# Patient Record
Sex: Male | Born: 1960 | Race: White | Hispanic: No | Marital: Married | Smoking: Former smoker
Health system: Southern US, Community
[De-identification: ages and names within clinical notes are randomized; demographics above are authoritative.]

## PROBLEM LIST (undated history)

## (undated) DIAGNOSIS — E7800 Pure hypercholesterolemia, unspecified: Secondary | ICD-10-CM

## (undated) DIAGNOSIS — Z8739 Personal history of other diseases of the musculoskeletal system and connective tissue: Secondary | ICD-10-CM

## (undated) DIAGNOSIS — F419 Anxiety disorder, unspecified: Secondary | ICD-10-CM

## (undated) DIAGNOSIS — G629 Polyneuropathy, unspecified: Secondary | ICD-10-CM

## (undated) DIAGNOSIS — I2510 Atherosclerotic heart disease of native coronary artery without angina pectoris: Secondary | ICD-10-CM

## (undated) DIAGNOSIS — I10 Essential (primary) hypertension: Secondary | ICD-10-CM

## (undated) DIAGNOSIS — G894 Chronic pain syndrome: Secondary | ICD-10-CM

## (undated) HISTORY — DX: Polyneuropathy, unspecified: G62.9

## (undated) HISTORY — DX: Essential (primary) hypertension: I10

## (undated) HISTORY — DX: Anxiety disorder, unspecified: F41.9

## (undated) HISTORY — DX: Chronic pain syndrome: G89.4

## (undated) HISTORY — DX: Atherosclerotic heart disease of native coronary artery without angina pectoris: I25.10

---

## 2011-07-25 ENCOUNTER — Emergency Department (HOSPITAL_COMMUNITY): Payer: Self-pay

## 2011-07-25 ENCOUNTER — Other Ambulatory Visit: Payer: Self-pay

## 2011-07-25 ENCOUNTER — Emergency Department (HOSPITAL_COMMUNITY)
Admission: EM | Admit: 2011-07-25 | Discharge: 2011-07-25 | Disposition: A | Discharge status: Home / Self Care | Payer: Self-pay | Attending: Emergency Medicine | Admitting: Emergency Medicine

## 2011-07-25 DIAGNOSIS — R05 Cough: Secondary | ICD-10-CM | POA: Insufficient documentation

## 2011-07-25 DIAGNOSIS — F17200 Nicotine dependence, unspecified, uncomplicated: Secondary | ICD-10-CM | POA: Insufficient documentation

## 2011-07-25 DIAGNOSIS — R059 Cough, unspecified: Secondary | ICD-10-CM | POA: Insufficient documentation

## 2011-07-25 DIAGNOSIS — J40 Bronchitis, not specified as acute or chronic: Secondary | ICD-10-CM | POA: Insufficient documentation

## 2011-07-25 DIAGNOSIS — X58XXXA Exposure to other specified factors, initial encounter: Secondary | ICD-10-CM | POA: Insufficient documentation

## 2011-07-25 DIAGNOSIS — R0602 Shortness of breath: Secondary | ICD-10-CM | POA: Insufficient documentation

## 2011-07-25 DIAGNOSIS — T148XXA Other injury of unspecified body region, initial encounter: Secondary | ICD-10-CM | POA: Insufficient documentation

## 2011-07-25 DIAGNOSIS — R1013 Epigastric pain: Secondary | ICD-10-CM | POA: Insufficient documentation

## 2011-07-25 MED ORDER — GUAIFENESIN-CODEINE 100-10 MG/5ML PO SYRP: ORAL_SOLUTION | ORAL | Status: DC

## 2011-07-25 MED ORDER — HYDROCODONE-ACETAMINOPHEN 5-325 MG PO TABS
1.0000 | ORAL_TABLET | Freq: Once | ORAL | Status: AC
  Administered 2011-07-25: 1 via ORAL
  Filled 2011-07-25: qty 1

## 2011-07-25 NOTE — ED Provider Notes (Signed)
Medical screening examination/treatment/procedure(s) were performed by non-physician practitioner and as supervising physician I was immediately available for consultation/collaboration.   Benny Lennert, MD 07/25/11 [phone]

## 2011-07-25 NOTE — ED Provider Notes (Signed)
Medical screening examination/treatment/procedure(s) were performed by non-physician practitioner and as supervising physician I was immediately available for consultation/collaboration.   Benny Lennert, MD 07/25/11 [phone]

## 2011-07-25 NOTE — ED Notes (Signed)
Pt DC to home in the care of family 

## 2011-07-25 NOTE — ED Provider Notes (Addendum)
History     CSN: [id number] Arrival date & time: 07/25/2011  8:05 AM   First MD Initiated Contact with Patient 07/25/11 [phone]      Chief Complaint  Patient presents with  . Cough  . Chest Pain  . Shortness of Breath    (Consider location/radiation/quality/duration/timing/severity/associated sxs/prior treatment) HPI Comments: Pt has been having "sharp" epigastric pain x 3 days.  No radiation.  Pain worse with coughing, deep inspiration, movement and palpation.  Cough is non-productive.  No fever or chills.  No h/o CAD.  No N/V, diaphoresis or pre-syncopal sxs.  The history is provided by the patient. No language interpreter was used.    History reviewed. No pertinent past medical history.  History reviewed. No pertinent past surgical history.  No family history on file.  History  Substance Use Topics  . Smoking status: Current Everyday Smoker  . Smokeless tobacco: Not on file  . Alcohol Use: No      Review of Systems  Constitutional: Negative for fever and chills.  Respiratory: Positive for cough and shortness of breath. Negative for chest tightness, wheezing and stridor.   Cardiovascular: Positive for chest pain.  Gastrointestinal: Negative for nausea, vomiting and diarrhea.  All other systems reviewed and are negative.    Allergies  Penicillins  Home Medications  No current outpatient prescriptions on file.  BP 135/105  Pulse 98  Temp(Src) 98 F (36.7 C) (Oral)  Resp 18  Ht 6' (1.829 m)  Wt 245 lb (111.131 kg)  BMI 33.23 kg/m2  SpO2 97%  Physical Exam  Nursing note and vitals reviewed. Constitutional: He is oriented to person, place, and time. He appears well-developed and well-nourished. No distress.  HENT:  Head: Normocephalic and atraumatic.  Eyes: EOM are normal.  Neck: Normal range of motion. No JVD present.  Cardiovascular: Normal rate, regular rhythm, normal heart sounds and intact distal pulses.  Exam reveals no gallop and no friction rub.    No murmur heard. Pulmonary/Chest: Effort normal and breath sounds normal. No respiratory distress. He has no decreased breath sounds. He has no wheezes. He has no rhonchi. He has no rales. He exhibits no tenderness, no bony tenderness, no swelling and no retraction.    Abdominal: Soft. He exhibits no distension. There is no tenderness.  Musculoskeletal: Normal range of motion.  Neurological: He is alert and oriented to person, place, and time.  Skin: Skin is warm and dry. He is not diaphoretic.  Psychiatric: He has a normal mood and affect. Judgment normal.    ED Course  Procedures (including critical care time)  Labs Reviewed - No data to display No results found.    No diagnosis found.    MDM    Date: 07/25/2011  Rate: 92   Rhythm: normal sinus rhythm  QRS Axis: normal  Intervals: normal  ST/T Wave abnormalities: normal  Conduction Disutrbances:none  Narrative Interpretation:   Old EKG Reviewed: none available        Worthy Rancher, PA 07/25/11 [id number]  Worthy Rancher, PA 07/25/11 [phone]

## 2011-07-25 NOTE — ED Notes (Signed)
Pt reports cough/congestion and fever since last Thurs.  Pt reports mid-sternal chest pain that began last night.  Pt reports inability to sleep last night d/t his chest pain.  Pt denies any n/v.

## 2014-08-11 HISTORY — PX: CORONARY ARTERY BYPASS GRAFT: SHX141

## 2017-03-11 ENCOUNTER — Telehealth: Payer: Self-pay

## 2017-03-11 NOTE — Telephone Encounter (Signed)
PT left VM that he is ready to schedule his colonoscopy.  H. [phone] C. [id number]

## 2017-03-12 NOTE — Telephone Encounter (Signed)
Pt called office. OV scheduled with [facility] 04/01/17.

## 2017-03-12 NOTE — Telephone Encounter (Signed)
How often is he taking Xanax? If it is every night, in addition to the Neurontin, we need to have them come in to discuss Propofol.

## 2017-03-12 NOTE — Telephone Encounter (Signed)
Tried to call with no answer  

## 2017-03-12 NOTE — Telephone Encounter (Signed)
Forwarding to Ginger to triage.

## 2017-03-12 NOTE — Telephone Encounter (Signed)
Gastroenterology Pre-Procedure Review  Request Date: Requesting Physician: DR.BUTLER  [facility]  PATIENT REVIEW QUESTIONS: The patient responded to the following health history questions as indicated:    1. Diabetes Melitis: NO 2. Joint replacements in the past 12 months: NO 3. Major health problems in the past 3 months: NO, HEART SURGERY 08/07/15 4. Has an artificial valve or MVP: NO 5. Has a defibrillator: NO 6. Has been advised in past to take antibiotics in advance of a procedure like teeth cleaning: NO 7. Family history of colon cancer: YES,FATHER 8. Alcohol Use: NO 9. History of sleep apnea: NO 10. History of coronary artery or other vascular stents placed within the last 12 months: NO 11. History of any prior anesthesia complications: NO    MEDICATIONS & ALLERGIES:    Patient reports the following regarding taking any blood thinners:   Plavix? NO Aspirin? YES Coumadin? NO Brilinta? NO Xarelto? NO Eliquis? NO Pradaxa? NO Savaysa? NO Effient? NO  Patient confirms/reports the following medications:  Current Outpatient Prescriptions  Medication Sig Dispense Refill  . ALPRAZolam (XANAX) 1 MG tablet Take 1 mg by mouth at bedtime.    Marland Kitchen. aspirin 81 MG chewable tablet Chew 81 mg by mouth daily.    Marland Kitchen. gabapentin (NEURONTIN) 300 MG capsule Take 300 mg by mouth at bedtime.    Marland Kitchen. loratadine (CLARITIN) 10 MG tablet Take 10 mg by mouth daily.    . metoprolol tartrate (LOPRESSOR) 25 MG tablet Take 25 mg by mouth once.    . pravastatin (PRAVACHOL) 40 MG tablet Take 40 mg by mouth daily.     No current facility-administered medications for this visit.     Patient confirms/reports the following allergies:  Allergies  Allergen Reactions  . Penicillins     No orders of the defined types were placed in this encounter.   AUTHORIZATION INFORMATION Primary Insurance: NO INSURANCE ,  ID #: ,  Group #:  Pre-Cert / Auth required:  Pre-Cert / Auth #:    SCHEDULE  INFORMATION: Procedure has been scheduled as follows:  Date: , Time:   Location:   This Gastroenterology Pre-Precedure Review Form is being routed to the following provider(s):

## 2017-04-01 ENCOUNTER — Encounter: Payer: Self-pay | Admitting: Gastroenterology

## 2017-04-01 ENCOUNTER — Telehealth: Payer: Self-pay

## 2017-04-01 ENCOUNTER — Other Ambulatory Visit: Payer: Self-pay

## 2017-04-01 ENCOUNTER — Ambulatory Visit (INDEPENDENT_AMBULATORY_CARE_PROVIDER_SITE_OTHER): Payer: Self-pay | Admitting: Gastroenterology

## 2017-04-01 DIAGNOSIS — R1319 Other dysphagia: Secondary | ICD-10-CM

## 2017-04-01 DIAGNOSIS — R1310 Dysphagia, unspecified: Secondary | ICD-10-CM

## 2017-04-01 DIAGNOSIS — Z1211 Encounter for screening for malignant neoplasm of colon: Secondary | ICD-10-CM | POA: Insufficient documentation

## 2017-04-01 DIAGNOSIS — Z800 Family history of malignant neoplasm of digestive organs: Secondary | ICD-10-CM

## 2017-04-01 NOTE — Progress Notes (Addendum)
REVIEWED-NO ADDITIONAL RECOMMENDATIONS.  Primary Care Physician:  Samuel Jester, DO  Primary Gastroenterologist:  Jonette Eva, MD   Chief Complaint  Patient presents with  . Colonoscopy    HPI:  Ethan Frazier is a 56 y.o. male here At the request of Dr. Charm Barges for consideration of screening colonoscopy. Patient's father diagnosed with stage IV colon cancer couple months ago, succumbed to disease couple weeks ago. He was 57 years old. No other family history of colon polyps or colon cancer. Patient has never had a colonoscopy. He is on chronic pain medication for neuropathy. Generally bowel movement 2-3 times per day to every other day, uses stool softeners on occasion. No rectal bleeding. No abdominal pain. No heartburn. He complains of solid food esophageal dysphagia, describes impaction requiring vomiting to release on a couple of occasions. Dysphagia worse with bread and meat. Patient is a former smoker, quit 2 years ago.    Current Outpatient Prescriptions  Medication Sig Dispense Refill  . ALPRAZolam (XANAX) 1 MG tablet Take 1 mg by mouth at bedtime.    Marland Kitchen aspirin 81 MG chewable tablet Chew 81 mg by mouth daily.    Marland Kitchen gabapentin (NEURONTIN) 300 MG capsule Take 300 mg by mouth at bedtime.    Marland Kitchen loratadine (CLARITIN) 10 MG tablet Take 10 mg by mouth daily.    . metoprolol tartrate (LOPRESSOR) 25 MG tablet Take 25 mg by mouth once.    Marland Kitchen oxyCODONE (ROXICODONE) 15 MG immediate release tablet Take by mouth.    . pravastatin (PRAVACHOL) 40 MG tablet Take 40 mg by mouth daily.     No current facility-administered medications for this visit.     Allergies as of 04/01/2017 - Review Complete 04/01/2017  Allergen Reaction Noted  . Penicillins  07/25/2011    Past Medical History:  Diagnosis Date  . Anxiety   . CAD (coronary artery disease)   . Chronic pain syndrome   . HTN (hypertension)   . Neuropathy     Past Surgical History:  Procedure Laterality Date  . CORONARY ARTERY  BYPASS GRAFT  2016    Family History  Problem Relation Age of Onset  . Colon cancer Father        diagnosed at age 72  . Lung cancer Mother     Social History   Social History  . Marital status: Married    Spouse name: N/A  . Number of children: 3  . Years of education: N/A   Occupational History  . logging    Social History Main Topics  . Smoking status: Former Smoker    Quit date: 04/02/2015  . Smokeless tobacco: Never Used  . Alcohol use No  . Drug use: Yes    Types: Marijuana  . Sexual activity: Not on file   Other Topics Concern  . Not on file   Social History Narrative  . No narrative on file      ROS:  General: Negative for anorexia, weight loss, fever, chills, fatigue, weakness. Eyes: Negative for vision changes.  ENT: Negative for hoarseness,  nasal congestion. See history of present illness CV: Negative for chest pain, angina, palpitations, dyspnea on exertion, peripheral edema.  Respiratory: Negative for dyspnea at rest, dyspnea on exertion, cough, sputum, wheezing.  GI: See history of present illness. GU:  Negative for dysuria, hematuria, urinary incontinence, urinary frequency, nocturnal urination.  MS: Chronic pain syndrome, joint pain, foot pain. Derm: Negative for rash or itching.  Neuro: Negative for weakness, abnormal sensation, seizure,  frequent headaches, memory loss, confusion.  Psych: Negative for anxiety, depression, suicidal ideation, hallucinations.  Endo: Negative for unusual weight change.  Heme: Negative for bruising or bleeding. Allergy: Negative for rash or hives.    Physical Examination:  BP 134/82   Pulse 76   Temp (!) 97.5 F (36.4 C) (Oral)   Ht 6' (1.829 m)   Wt 238 lb 9.6 oz (108.2 kg)   BMI 32.36 kg/m    General: Well-nourished, well-developed in no acute distress.  Head: Normocephalic, atraumatic.   Eyes: Conjunctiva pink, no icterus. Mouth: Oropharyngeal mucosa moist and pink , no lesions erythema or  exudate. Neck: Supple without thyromegaly, masses, or lymphadenopathy.  Lungs: Clear to auscultation bilaterally.  Heart: Regular rate and rhythm, no murmurs rubs or gallops.  Abdomen: Bowel sounds are normal, nontender, nondistended, no hepatosplenomegaly or masses, no abdominal bruits or    hernia , no rebound or guarding.   Rectal: Not performed. Extremities: No lower extremity edema. No clubbing or deformities.  Neuro: Alert and oriented x 4 , grossly normal neurologically.  Skin: Warm and dry, no rash or jaundice.   Psych: Alert and cooperative, normal mood and affect.  Imaging Studies: No results found.

## 2017-04-01 NOTE — Assessment & Plan Note (Signed)
Average risk screening colonoscopy in the near future. Patient's father had stage IV colon cancer diagnosed at age 57. Patient without significant GI issues. Occasional constipation likely related narcotics. Prior to bowel perforation, recommend use of MiraLAX on a regular basis to facilitate good bowel prep. Patient provided instructions. Colonoscopy in the near future with deep sedation in the OR given polypharmacy.  I have discussed the risks, alternatives, benefits with regards to but not limited to the risk of reaction to medication, bleeding, infection, perforation and the patient is agreeable to proceed. Written consent to be obtained.

## 2017-04-01 NOTE — Progress Notes (Signed)
cc'ed to pcp °

## 2017-04-01 NOTE — Assessment & Plan Note (Signed)
Solid food esophageal dysphagia with near impactions at times requiring vomiting to get relief. No typical heartburn on regular basis. Recommend EGD with dilation in the near future. Deep sedation in the OR given polypharmacy.  I have discussed the risks, alternatives, benefits with regards to but not limited to the risk of reaction to medication, bleeding, infection, perforation and the patient is agreeable to proceed. Written consent to be obtained.

## 2017-04-01 NOTE — Patient Instructions (Signed)
1. The week or so before your colonoscopy, begin miralax one to two times daily to keep bowels regular and soft. You may use chronically once daily as needed.  2. Colonoscopy and upper endoscopy as scheduled. See separate instructions.

## 2017-04-01 NOTE — Telephone Encounter (Signed)
Called and informed pt of pre-op appt 04/22/17 at 9:00am. Letter also mailed.

## 2017-04-20 NOTE — Patient Instructions (Signed)
Ethan Frazier  04/20/2017     @   Your procedure is scheduled on  04/28/2017 .  Report to [facility] at  815  A.M.  Call this number if you have problems the morning of surgery:  [phone]   Remember:  Do not eat food or drink liquids after midnight.  Take these medicines the morning of surgery with A SIP OF WATER  Claritin, metoprolol, oxycodone.   Do not wear jewelry, make-up or nail polish.  Do not wear lotions, powders, or perfumes, or deoderant.  Do not shave 48 hours prior to surgery.  Men may shave face and neck.  Do not bring valuables to the hospital.  [facility] is not responsible for any belongings or valuables.  Contacts, dentures or bridgework may not be worn into surgery.  Leave your suitcase in the car.  After surgery it may be brought to your room.  For patients admitted to the hospital, discharge time will be determined by your treatment team.  Patients discharged the day of surgery will not be allowed to drive home.   Name and phone number of your driver:   family Special instructions:  Follow the diet and prep instructions given to you by Dr Evelina Dun office.  Please read over the following fact sheets that you were given. Anesthesia Post-op Instructions and Care and Recovery After Surgery       Esophagogastroduodenoscopy Esophagogastroduodenoscopy (EGD) is a procedure to examine the lining of the esophagus, stomach, and first part of the small intestine (duodenum). This procedure is done to check for problems such as inflammation, bleeding, ulcers, or growths. During this procedure, a long, flexible, lighted tube with a camera attached (endoscope) is inserted down the throat. Tell a health care provider about:  Any allergies you have.  All medicines you are taking, including vitamins, herbs, eye drops, creams, and over-the-counter medicines.  Any problems you or family members have had with anesthetic  medicines.  Any blood disorders you have.  Any surgeries you have had.  Any medical conditions you have.  Whether you are pregnant or may be pregnant. What are the risks? Generally, this is a safe procedure. However, problems may occur, including:  Infection.  Bleeding.  A tear (perforation) in the esophagus, stomach, or duodenum.  Trouble breathing.  Excessive sweating.  Spasms of the larynx.  A slowed heartbeat.  Low blood pressure.  What happens before the procedure?  Follow instructions from your health care provider about eating or drinking restrictions.  Ask your health care provider about: ? Changing or stopping your regular medicines. This is especially important if you are taking diabetes medicines or blood thinners. ? Taking medicines such as aspirin and ibuprofen. These medicines can thin your blood. Do not take these medicines before your procedure if your health care provider instructs you not to.  Plan to have someone take you home after the procedure.  If you wear dentures, be ready to remove them before the procedure. What happens during the procedure?  To reduce your risk of infection, your health care team will wash or sanitize their hands.  An IV tube will be put in a vein in your hand or arm. You will get medicines and fluids through this tube.  You will be given one or more of the following: ? A medicine to help you relax (sedative). ? A medicine to numb the area (local  anesthetic). This medicine may be sprayed into your throat. It will make you feel more comfortable and keep you from gagging or coughing during the procedure. ? A medicine for pain.  A mouth guard may be placed in your mouth to protect your teeth and to keep you from biting on the endoscope.  You will be asked to lie on your left side.  The endoscope will be lowered down your throat into your esophagus, stomach, and duodenum.  Air will be put into the endoscope. This will  help your health care provider see better.  The lining of your esophagus, stomach, and duodenum will be examined.  Your health care provider may: ? Take a tissue sample so it can be looked at in a lab (biopsy). ? Remove growths. ? Remove objects (foreign bodies) that are stuck. ? Treat any bleeding with medicines or other devices that stop tissue from bleeding. ? Widen (dilate) or stretch narrowed areas of your esophagus and stomach.  The endoscope will be taken out. The procedure may vary among health care providers and hospitals. What happens after the procedure?  Your blood pressure, heart rate, breathing rate, and blood oxygen level will be monitored often until the medicines you were given have worn off.  Do not eat or drink anything until the numbing medicine has worn off and your gag reflex has returned. This information is not intended to replace advice given to you by your health care provider. Make sure you discuss any questions you have with your health care provider. Document Released: 11/28/2004 Document Revised: 01/03/2016 Document Reviewed: 06/21/2015 Elsevier Interactive Patient Education  2018 [employer].  Esophageal Dilatation Esophageal dilatation is a procedure to open a blocked or narrowed part of the esophagus. The esophagus is the long tube in your throat that carries food and liquid from your mouth to your stomach. The procedure is also called esophageal dilation. You may need this procedure if you have a buildup of scar tissue in your esophagus that makes it difficult, painful, or even impossible to swallow. This can be caused by gastroesophageal reflux disease (GERD). In rare cases, people need this procedure because they have cancer of the esophagus or a problem with the way food moves through the esophagus. Sometimes you may need to have another dilatation to enlarge the opening of the esophagus gradually. Tell a health care provider about:  Any allergies you  have.  All medicines you are taking, including vitamins, herbs, eye drops, creams, and over-the-counter medicines.  Any problems you or family members have had with anesthetic medicines.  Any blood disorders you have.  Any surgeries you have had.  Any medical conditions you have.  Any antibiotic medicines you are required to take before dental procedures. What are the risks? Generally, this is a safe procedure. However, problems can occur and include:  Bleeding from a tear in the lining of the esophagus.  A hole (perforation) in the esophagus.  What happens before the procedure?  Do not eat or drink anything after midnight on the night before the procedure or as directed by your health care provider.  Ask your health care provider about changing or stopping your regular medicines. This is especially important if you are taking diabetes medicines or blood thinners.  Plan to have someone take you home after the procedure. What happens during the procedure?  You will be given a medicine that makes you relaxed and sleepy (sedative).  A medicine may be sprayed or gargled to numb  the back of the throat.  Your health care provider can use various instruments to do an esophageal dilatation. During the procedure, the instrument used will be placed in your mouth and passed down into your esophagus. Options include: ? Simple dilators. This instrument is carefully placed in the esophagus to stretch it. ? Guided wire bougies. In this method, a flexible tube (endoscope) is used to insert a wire into the esophagus. The dilator is passed over this wire to enlarge the esophagus. Then the wire is removed. ? Balloon dilators. An endoscope with a small balloon at the end is passed down into the esophagus. Inflating the balloon gently stretches the esophagus and opens it up. What happens after the procedure?  Your blood pressure, heart rate, breathing rate, and blood oxygen level will be monitored  often until the medicines you were given have worn off.  Your throat may feel slightly sore and will probably still feel numb. This will improve slowly over time.  You will not be allowed to eat or drink until the throat numbness has resolved.  If this is a same-day procedure, you may be allowed to go home once you have been able to drink, urinate, and sit on the edge of the bed without nausea or dizziness.  If this is a same-day procedure, you should have a friend or family member with you for the next 24 hours after the procedure. This information is not intended to replace advice given to you by your health care provider. Make sure you discuss any questions you have with your health care provider. Document Released: 09/18/2005 Document Revised: 01/03/2016 Document Reviewed: 12/07/2013 Elsevier Interactive Patient Education  2017 [employer] Esophagogastroduodenoscopy, Care After Refer to this sheet in the next few weeks. These instructions provide you with information about caring for yourself after your procedure. Your health care provider may also give you more specific instructions. Your treatment has been planned according to current medical practices, but problems sometimes occur. Call your health care provider if you have any problems or questions after your procedure. What can I expect after the procedure? After the procedure, it is common to have:  A sore throat.  Nausea.  Bloating.  Dizziness.  Fatigue.  Follow these instructions at home:  Do not eat or drink anything until the numbing medicine (local anesthetic) has worn off and your gag reflex has returned. You will know that the local anesthetic has worn off when you can swallow comfortably.  Do not drive for 24 hours if you received a medicine to help you relax (sedative).  If your health care provider took a tissue sample for testing during the procedure, make sure to get your test results. This is your  responsibility. Ask your health care provider or the department performing the test when your results will be ready.  Keep all follow-up visits as told by your health care provider. This is important. Contact a health care provider if:  You cannot stop coughing.  You are not urinating.  You are urinating less than usual. Get help right away if:  You have trouble swallowing.  You cannot eat or drink.  You have throat or chest pain that gets worse.  You are dizzy or light-headed.  You faint.  You have nausea or vomiting.  You have chills.  You have a fever.  You have severe abdominal pain.  You have black, tarry, or bloody stools. This information is not intended to replace advice given to you by your  health care provider. Make sure you discuss any questions you have with your health care provider. Document Released: 07/14/2012 Document Revised: 01/03/2016 Document Reviewed: 06/21/2015 Elsevier Interactive Patient Education  2018 [employer].  Colonoscopy, Adult A colonoscopy is an exam to look at the entire large intestine. During the exam, a lubricated, bendable tube is inserted into the anus and then passed into the rectum, colon, and other parts of the large intestine. A colonoscopy is often done as a part of normal colorectal screening or in response to certain symptoms, such as anemia, persistent diarrhea, abdominal pain, and blood in the stool. The exam can help screen for and diagnose medical problems, including:  Tumors.  Polyps.  Inflammation.  Areas of bleeding.  Tell a health care provider about:  Any allergies you have.  All medicines you are taking, including vitamins, herbs, eye drops, creams, and over-the-counter medicines.  Any problems you or family members have had with anesthetic medicines.  Any blood disorders you have.  Any surgeries you have had.  Any medical conditions you have.  Any problems you have had passing stool. What are  the risks? Generally, this is a safe procedure. However, problems may occur, including:  Bleeding.  A tear in the intestine.  A reaction to medicines given during the exam.  Infection (rare).  What happens before the procedure? Eating and drinking restrictions Follow instructions from your health care provider about eating and drinking, which may include:  A few days before the procedure - follow a low-fiber diet. Avoid nuts, seeds, dried fruit, raw fruits, and vegetables.  1-3 days before the procedure - follow a clear liquid diet. Drink only clear liquids, such as clear broth or bouillon, black coffee or tea, clear juice, clear soft drinks or sports drinks, gelatin dessert, and popsicles. Avoid any liquids that contain red or purple dye.  On the day of the procedure - do not eat or drink anything during the 2 hours before the procedure, or within the time period that your health care provider recommends.  Bowel prep If you were prescribed an oral bowel prep to clean out your colon:  Take it as told by your health care provider. Starting the day before your procedure, you will need to drink a large amount of medicated liquid. The liquid will cause you to have multiple loose stools until your stool is almost clear or light green.  If your skin or anus gets irritated from diarrhea, you may use these to relieve the irritation: ? Medicated wipes, such as adult wet wipes with aloe and vitamin E. ? A skin soothing-product like petroleum jelly.  If you vomit while drinking the bowel prep, take a break for up to 60 minutes and then begin the bowel prep again. If vomiting continues and you cannot take the bowel prep without vomiting, call your health care provider.  General instructions  Ask your health care provider about changing or stopping your regular medicines. This is especially important if you are taking diabetes medicines or blood thinners.  Plan to have someone take you home  from the hospital or clinic. What happens during the procedure?  An IV tube may be inserted into one of your veins.  You will be given medicine to help you relax (sedative).  To reduce your risk of infection: ? Your health care team will wash or sanitize their hands. ? Your anal area will be washed with soap.  You will be asked to lie on your side with  your knees bent.  Your health care provider will lubricate a long, thin, flexible tube. The tube will have a camera and a light on the end.  The tube will be inserted into your anus.  The tube will be gently eased through your rectum and colon.  Air will be delivered into your colon to keep it open. You may feel some pressure or cramping.  The camera will be used to take images during the procedure.  A small tissue sample may be removed from your body to be examined under a microscope (biopsy). If any potential problems are found, the tissue will be sent to a lab for testing.  If small polyps are found, your health care provider may remove them and have them checked for cancer cells.  The tube that was inserted into your anus will be slowly removed. The procedure may vary among health care providers and hospitals. What happens after the procedure?  Your blood pressure, heart rate, breathing rate, and blood oxygen level will be monitored until the medicines you were given have worn off.  Do not drive for 24 hours after the exam.  You may have a small amount of blood in your stool.  You may pass gas and have mild abdominal cramping or bloating due to the air that was used to inflate your colon during the exam.  It is up to you to get the results of your procedure. Ask your health care provider, or the department performing the procedure, when your results will be ready. This information is not intended to replace advice given to you by your health care provider. Make sure you discuss any questions you have with your health care  provider. Document Released: 07/25/2000 Document Revised: 05/28/2016 Document Reviewed: 10/09/2015 Elsevier Interactive Patient Education  2018 [employer].  Colonoscopy, Adult, Care After This sheet gives you information about how to care for yourself after your procedure. Your health care provider may also give you more specific instructions. If you have problems or questions, contact your health care provider. What can I expect after the procedure? After the procedure, it is common to have:  A small amount of blood in your stool for 24 hours after the procedure.  Some gas.  Mild abdominal cramping or bloating.  Follow these instructions at home: General instructions   For the first 24 hours after the procedure: ? Do not drive or use machinery. ? Do not sign important documents. ? Do not drink alcohol. ? Do your regular daily activities at a slower pace than normal. ? Eat soft, easy-to-digest foods. ? Rest often.  Take over-the-counter or prescription medicines only as told by your health care provider.  It is up to you to get the results of your procedure. Ask your health care provider, or the department performing the procedure, when your results will be ready. Relieving cramping and bloating  Try walking around when you have cramps or feel bloated.  Apply heat to your abdomen as told by your health care provider. Use a heat source that your health care provider recommends, such as a moist heat pack or a heating pad. ? Place a towel between your skin and the heat source. ? Leave the heat on for 20-30 minutes. ? Remove the heat if your skin turns bright red. This is especially important if you are unable to feel pain, heat, or cold. You may have a greater risk of getting burned. Eating and drinking  Drink enough fluid to keep your  urine clear or pale yellow.  Resume your normal diet as instructed by your health care provider. Avoid heavy or fried foods that are hard to  digest.  Avoid drinking alcohol for as long as instructed by your health care provider. Contact a health care provider if:  You have blood in your stool 2-3 days after the procedure. Get help right away if:  You have more than a small spotting of blood in your stool.  You pass large blood clots in your stool.  Your abdomen is swollen.  You have nausea or vomiting.  You have a fever.  You have increasing abdominal pain that is not relieved with medicine. This information is not intended to replace advice given to you by your health care provider. Make sure you discuss any questions you have with your health care provider. Document Released: 03/11/2004 Document Revised: 04/21/2016 Document Reviewed: 10/09/2015 Elsevier Interactive Patient Education  2018 [employer] Anesthesia is a term that refers to techniques, procedures, and medicines that help a person stay safe and comfortable during a medical procedure. Monitored anesthesia care, or sedation, is one type of anesthesia. Your anesthesia specialist may recommend sedation if you will be having a procedure that does not require you to be unconscious, such as:  Cataract surgery.  A dental procedure.  A biopsy.  A colonoscopy.  During the procedure, you may receive a medicine to help you relax (sedative). There are three levels of sedation:  Mild sedation. At this level, you may feel awake and relaxed. You will be able to follow directions.  Moderate sedation. At this level, you will be sleepy. You may not remember the procedure.  Deep sedation. At this level, you will be asleep. You will not remember the procedure.  The more medicine you are given, the deeper your level of sedation will be. Depending on how you respond to the procedure, the anesthesia specialist may change your level of sedation or the type of anesthesia to fit your needs. An anesthesia specialist will monitor you closely during  the procedure. Let your health care provider know about:  Any allergies you have.  All medicines you are taking, including vitamins, herbs, eye drops, creams, and over-the-counter medicines.  Any use of steroids (by mouth or as a cream).  Any problems you or family members have had with sedatives and anesthetic medicines.  Any blood disorders you have.  Any surgeries you have had.  Any medical conditions you have, such as sleep apnea.  Whether you are pregnant or may be pregnant.  Any use of cigarettes, alcohol, or street drugs. What are the risks? Generally, this is a safe procedure. However, problems may occur, including:  Getting too much medicine (oversedation).  Nausea.  Allergic reaction to medicines.  Trouble breathing. If this happens, a breathing tube may be used to help with breathing. It will be removed when you are awake and breathing on your own.  Heart trouble.  Lung trouble.  Before the procedure Staying hydrated Follow instructions from your health care provider about hydration, which may include:  Up to 2 hours before the procedure - you may continue to drink clear liquids, such as water, clear fruit juice, black coffee, and plain tea.  Eating and drinking restrictions Follow instructions from your health care provider about eating and drinking, which may include:  8 hours before the procedure - stop eating heavy meals or foods such as meat, fried foods, or fatty foods.  6 hours  before the procedure - stop eating light meals or foods, such as toast or cereal.  6 hours before the procedure - stop drinking milk or drinks that contain milk.  2 hours before the procedure - stop drinking clear liquids.  Medicines Ask your health care provider about:  Changing or stopping your regular medicines. This is especially important if you are taking diabetes medicines or blood thinners.  Taking medicines such as aspirin and ibuprofen. These medicines can  thin your blood. Do not take these medicines before your procedure if your health care provider instructs you not to.  Tests and exams  You will have a physical exam.  You may have blood tests done to show: ? How well your kidneys and liver are working. ? How well your blood can clot.  General instructions  Plan to have someone take you home from the hospital or clinic.  If you will be going home right after the procedure, plan to have someone with you for 24 hours.  What happens during the procedure?  Your blood pressure, heart rate, breathing, level of pain and overall condition will be monitored.  An IV tube will be inserted into one of your veins.  Your anesthesia specialist will give you medicines as needed to keep you comfortable during the procedure. This may mean changing the level of sedation.  The procedure will be performed. After the procedure  Your blood pressure, heart rate, breathing rate, and blood oxygen level will be monitored until the medicines you were given have worn off.  Do not drive for 24 hours if you received a sedative.  You may: ? Feel sleepy, clumsy, or nauseous. ? Feel forgetful about what happened after the procedure. ? Have a sore throat if you had a breathing tube during the procedure. ? Vomit. This information is not intended to replace advice given to you by your health care provider. Make sure you discuss any questions you have with your health care provider. Document Released: 04/23/2005 Document Revised: 01/04/2016 Document Reviewed: 11/18/2015 Elsevier Interactive Patient Education  2018 [employer], Care After These instructions provide you with information about caring for yourself after your procedure. Your health care provider may also give you more specific instructions. Your treatment has been planned according to current medical practices, but problems sometimes occur. Call your health care provider if  you have any problems or questions after your procedure. What can I expect after the procedure? After your procedure, it is common to:  Feel sleepy for several hours.  Feel clumsy and have poor balance for several hours.  Feel forgetful about what happened after the procedure.  Have poor judgment for several hours.  Feel nauseous or vomit.  Have a sore throat if you had a breathing tube during the procedure.  Follow these instructions at home: For at least 24 hours after the procedure:   Do not: ? Participate in activities in which you could fall or become injured. ? Drive. ? Use heavy machinery. ? Drink alcohol. ? Take sleeping pills or medicines that cause drowsiness. ? Make important decisions or sign legal documents. ? Take care of children on your own.  Rest. Eating and drinking  Follow the diet that is recommended by your health care provider.  If you vomit, drink water, juice, or soup when you can drink without vomiting.  Make sure you have little or no nausea before eating solid foods. General instructions  Have a responsible adult stay with you  until you are awake and alert.  Take over-the-counter and prescription medicines only as told by your health care provider.  If you smoke, do not smoke without supervision.  Keep all follow-up visits as told by your health care provider. This is important. Contact a health care provider if:  You keep feeling nauseous or you keep vomiting.  You feel light-headed.  You develop a rash.  You have a fever. Get help right away if:  You have trouble breathing. This information is not intended to replace advice given to you by your health care provider. Make sure you discuss any questions you have with your health care provider. Document Released: 11/18/2015 Document Revised: 03/19/2016 Document Reviewed: 11/18/2015 Elsevier Interactive Patient Education  [employer].

## 2017-04-22 ENCOUNTER — Encounter (HOSPITAL_COMMUNITY): Payer: Self-pay

## 2017-04-22 ENCOUNTER — Encounter (HOSPITAL_COMMUNITY)
Admission: RE | Admit: 2017-04-22 | Discharge: 2017-04-22 | Disposition: A | Discharge status: Home / Self Care | Payer: Self-pay | Source: Ambulatory Visit | Attending: Gastroenterology | Admitting: Gastroenterology

## 2017-04-22 DIAGNOSIS — R1319 Other dysphagia: Secondary | ICD-10-CM | POA: Insufficient documentation

## 2017-04-22 DIAGNOSIS — Z79891 Long term (current) use of opiate analgesic: Secondary | ICD-10-CM | POA: Insufficient documentation

## 2017-04-22 DIAGNOSIS — Z79899 Other long term (current) drug therapy: Secondary | ICD-10-CM | POA: Insufficient documentation

## 2017-04-22 DIAGNOSIS — Z880 Allergy status to penicillin: Secondary | ICD-10-CM | POA: Insufficient documentation

## 2017-04-22 DIAGNOSIS — Z801 Family history of malignant neoplasm of trachea, bronchus and lung: Secondary | ICD-10-CM | POA: Insufficient documentation

## 2017-04-22 DIAGNOSIS — Z01812 Encounter for preprocedural laboratory examination: Secondary | ICD-10-CM | POA: Insufficient documentation

## 2017-04-22 DIAGNOSIS — Z1211 Encounter for screening for malignant neoplasm of colon: Secondary | ICD-10-CM | POA: Insufficient documentation

## 2017-04-22 DIAGNOSIS — Z87891 Personal history of nicotine dependence: Secondary | ICD-10-CM | POA: Insufficient documentation

## 2017-04-22 DIAGNOSIS — Z800 Family history of malignant neoplasm of digestive organs: Secondary | ICD-10-CM | POA: Insufficient documentation

## 2017-04-22 DIAGNOSIS — K5900 Constipation, unspecified: Secondary | ICD-10-CM | POA: Insufficient documentation

## 2017-04-22 HISTORY — DX: Personal history of other diseases of the musculoskeletal system and connective tissue: Z87.39

## 2017-04-22 HISTORY — DX: Pure hypercholesterolemia, unspecified: E78.00

## 2017-04-22 LAB — BASIC METABOLIC PANEL
Anion gap: 10 (ref 5–15)
BUN: 14 mg/dL (ref 6–20)
CALCIUM: 9.1 mg/dL (ref 8.9–10.3)
CHLORIDE: 104 mmol/L (ref 101–111)
CO2: 24 mmol/L (ref 22–32)
Creatinine, Ser: 0.76 mg/dL (ref 0.61–1.24)
GFR calc non Af Amer: >60 mL/min (ref >60)
GLUCOSE: 130 mg/dL — AB (ref 65–99)
Potassium: 3.6 mmol/L (ref 3.5–5.1)
Sodium: 138 mmol/L (ref 135–145)

## 2017-04-22 LAB — CBC WITH DIFFERENTIAL/PLATELET
Basophils Absolute: 0 10*3/uL (ref 0.0–0.1)
Basophils Relative: 0 %
Eosinophils Absolute: 0.3 10*3/uL (ref 0.0–0.7)
Eosinophils Relative: 3 %
HEMATOCRIT: 42.8 % (ref 39.0–52.0)
HEMOGLOBIN: 14.3 g/dL (ref 13.0–17.0)
LYMPHS ABS: 2.5 10*3/uL (ref 0.7–4.0)
LYMPHS PCT: 26 %
MCH: 28.8 pg (ref 26.0–34.0)
MCHC: 33.4 g/dL (ref 30.0–36.0)
MCV: 86.1 fL (ref 78.0–100.0)
Monocytes Absolute: 0.7 10*3/uL (ref 0.1–1.0)
Monocytes Relative: 7 %
NEUTROS ABS: 6.3 10*3/uL (ref 1.7–7.7)
Neutrophils Relative %: 64 %
Platelets: 229 10*3/uL (ref 150–400)
RBC: 4.97 MIL/uL (ref 4.22–5.81)
RDW: 13.1 % (ref 11.5–15.5)
WBC: 9.8 10*3/uL (ref 4.0–10.5)

## 2017-04-22 NOTE — Progress Notes (Signed)
   04/22/17 0913  OBSTRUCTIVE SLEEP APNEA  Have you ever been diagnosed with sleep apnea through a sleep study? No  Do you snore loudly (loud enough to be heard through closed doors)?  1  Do you often feel tired, fatigued, or sleepy during the daytime (such as falling asleep during driving or talking to someone)? 0  Has anyone observed you stop breathing during your sleep? 0  Do you have, or are you being treated for high blood pressure? 1  BMI more than 35 kg/m2? 1  Age > 50 (1-yes) 1  Neck circumference greater than:Male 16 inches or larger, Male 17inches or larger? 1  Male Gender (Yes=1) 1  Obstructive Sleep Apnea Score 6  Score 5 or greater  Results sent to PCP

## 2017-04-28 ENCOUNTER — Encounter (HOSPITAL_COMMUNITY)
Admission: RE | Disposition: A | Discharge status: Home / Self Care | Payer: Self-pay | Source: Ambulatory Visit | Attending: Gastroenterology

## 2017-04-28 ENCOUNTER — Encounter (HOSPITAL_COMMUNITY): Payer: Self-pay | Admitting: *Deleted

## 2017-04-28 ENCOUNTER — Ambulatory Visit (HOSPITAL_COMMUNITY): Payer: Self-pay | Admitting: Anesthesiology

## 2017-04-28 ENCOUNTER — Ambulatory Visit (HOSPITAL_COMMUNITY)
Admission: RE | Admit: 2017-04-28 | Discharge: 2017-04-28 | Disposition: A | Discharge status: Home / Self Care | Payer: Self-pay | Source: Ambulatory Visit | Attending: Gastroenterology | Admitting: Gastroenterology

## 2017-04-28 DIAGNOSIS — G629 Polyneuropathy, unspecified: Secondary | ICD-10-CM | POA: Insufficient documentation

## 2017-04-28 DIAGNOSIS — Z951 Presence of aortocoronary bypass graft: Secondary | ICD-10-CM | POA: Insufficient documentation

## 2017-04-28 DIAGNOSIS — Q396 Congenital diverticulum of esophagus: Secondary | ICD-10-CM | POA: Insufficient documentation

## 2017-04-28 DIAGNOSIS — Z1211 Encounter for screening for malignant neoplasm of colon: Secondary | ICD-10-CM

## 2017-04-28 DIAGNOSIS — Z7982 Long term (current) use of aspirin: Secondary | ICD-10-CM | POA: Insufficient documentation

## 2017-04-28 DIAGNOSIS — Z1212 Encounter for screening for malignant neoplasm of rectum: Secondary | ICD-10-CM

## 2017-04-28 DIAGNOSIS — K6289 Other specified diseases of anus and rectum: Secondary | ICD-10-CM

## 2017-04-28 DIAGNOSIS — K644 Residual hemorrhoidal skin tags: Secondary | ICD-10-CM | POA: Insufficient documentation

## 2017-04-28 DIAGNOSIS — E7800 Pure hypercholesterolemia, unspecified: Secondary | ICD-10-CM | POA: Insufficient documentation

## 2017-04-28 DIAGNOSIS — K648 Other hemorrhoids: Secondary | ICD-10-CM | POA: Insufficient documentation

## 2017-04-28 DIAGNOSIS — T39395A Adverse effect of other nonsteroidal anti-inflammatory drugs [NSAID], initial encounter: Secondary | ICD-10-CM

## 2017-04-28 DIAGNOSIS — K2960 Other gastritis without bleeding: Secondary | ICD-10-CM

## 2017-04-28 DIAGNOSIS — Z79899 Other long term (current) drug therapy: Secondary | ICD-10-CM | POA: Insufficient documentation

## 2017-04-28 DIAGNOSIS — Q438 Other specified congenital malformations of intestine: Secondary | ICD-10-CM | POA: Insufficient documentation

## 2017-04-28 DIAGNOSIS — Z87891 Personal history of nicotine dependence: Secondary | ICD-10-CM | POA: Insufficient documentation

## 2017-04-28 DIAGNOSIS — Z800 Family history of malignant neoplasm of digestive organs: Secondary | ICD-10-CM | POA: Insufficient documentation

## 2017-04-28 DIAGNOSIS — K222 Esophageal obstruction: Secondary | ICD-10-CM

## 2017-04-28 DIAGNOSIS — I10 Essential (primary) hypertension: Secondary | ICD-10-CM | POA: Insufficient documentation

## 2017-04-28 DIAGNOSIS — I2510 Atherosclerotic heart disease of native coronary artery without angina pectoris: Secondary | ICD-10-CM | POA: Insufficient documentation

## 2017-04-28 DIAGNOSIS — F419 Anxiety disorder, unspecified: Secondary | ICD-10-CM | POA: Insufficient documentation

## 2017-04-28 DIAGNOSIS — R1319 Other dysphagia: Secondary | ICD-10-CM

## 2017-04-28 DIAGNOSIS — K2970 Gastritis, unspecified, without bleeding: Secondary | ICD-10-CM

## 2017-04-28 DIAGNOSIS — K5730 Diverticulosis of large intestine without perforation or abscess without bleeding: Secondary | ICD-10-CM | POA: Insufficient documentation

## 2017-04-28 DIAGNOSIS — K210 Gastro-esophageal reflux disease with esophagitis: Secondary | ICD-10-CM | POA: Insufficient documentation

## 2017-04-28 DIAGNOSIS — R1310 Dysphagia, unspecified: Secondary | ICD-10-CM

## 2017-04-28 HISTORY — PX: COLONOSCOPY WITH PROPOFOL: SHX5780

## 2017-04-28 HISTORY — PX: BIOPSY: SHX5522

## 2017-04-28 HISTORY — PX: SAVORY DILATION: SHX5439

## 2017-04-28 HISTORY — PX: ESOPHAGOGASTRODUODENOSCOPY (EGD) WITH PROPOFOL: SHX5813

## 2017-04-28 HISTORY — PX: POLYPECTOMY: SHX5525

## 2017-04-28 SURGERY — COLONOSCOPY WITH PROPOFOL
Anesthesia: Monitor Anesthesia Care

## 2017-04-28 MED ORDER — PROPOFOL 10 MG/ML IV BOLUS
INTRAVENOUS | Status: AC
  Filled 2017-04-28: qty 40

## 2017-04-28 MED ORDER — GLYCOPYRROLATE 0.2 MG/ML IJ SOLN
0.2000 mg | Freq: Once | INTRAMUSCULAR | Status: AC | PRN
  Administered 2017-04-28: 0.2 mg via INTRAVENOUS

## 2017-04-28 MED ORDER — CHLORHEXIDINE GLUCONATE CLOTH 2 % EX PADS: 6.0000 | MEDICATED_PAD | Freq: Once | CUTANEOUS | Status: DC

## 2017-04-28 MED ORDER — MIDAZOLAM HCL 2 MG/2ML IJ SOLN
INTRAMUSCULAR | Status: AC
  Filled 2017-04-28: qty 2

## 2017-04-28 MED ORDER — LIDOCAINE VISCOUS 2 % MT SOLN
OROMUCOSAL | Status: AC
  Filled 2017-04-28: qty 15

## 2017-04-28 MED ORDER — PROPOFOL 10 MG/ML IV BOLUS
INTRAVENOUS | Status: AC
  Filled 2017-04-28: qty 20

## 2017-04-28 MED ORDER — MIDAZOLAM HCL 2 MG/2ML IJ SOLN
1.0000 mg | INTRAMUSCULAR | Status: AC
  Administered 2017-04-28 (×2): 2 mg via INTRAVENOUS
  Filled 2017-04-28: qty 2

## 2017-04-28 MED ORDER — EPINEPHRINE PF 1 MG/10ML IJ SOSY
PREFILLED_SYRINGE | INTRAMUSCULAR | Status: AC
  Filled 2017-04-28: qty 10

## 2017-04-28 MED ORDER — FENTANYL CITRATE (PF) 100 MCG/2ML IJ SOLN
25.0000 ug | Freq: Once | INTRAMUSCULAR | Status: AC
  Administered 2017-04-28: 25 ug via INTRAVENOUS

## 2017-04-28 MED ORDER — FENTANYL CITRATE (PF) 100 MCG/2ML IJ SOLN
INTRAMUSCULAR | Status: AC
  Filled 2017-04-28: qty 2

## 2017-04-28 MED ORDER — PANTOPRAZOLE SODIUM 40 MG PO TBEC: DELAYED_RELEASE_TABLET | ORAL | 11 refills | Status: AC

## 2017-04-28 MED ORDER — PROPOFOL 500 MG/50ML IV EMUL
INTRAVENOUS | Status: DC | PRN
  Administered 2017-04-28 (×2): via INTRAVENOUS
  Administered 2017-04-28: 150 ug/kg/min via INTRAVENOUS

## 2017-04-28 MED ORDER — LIDOCAINE VISCOUS 2 % MT SOLN
5.0000 mL | Freq: Once | OROMUCOSAL | Status: AC
  Administered 2017-04-28: 5 mL via OROMUCOSAL

## 2017-04-28 MED ORDER — GLYCOPYRROLATE 0.2 MG/ML IJ SOLN
INTRAMUSCULAR | Status: AC
  Filled 2017-04-28: qty 1

## 2017-04-28 MED ORDER — LACTATED RINGERS IV SOLN
INTRAVENOUS | Status: DC
  Administered 2017-04-28: 09:00:00 via INTRAVENOUS

## 2017-04-28 NOTE — Anesthesia Postprocedure Evaluation (Signed)
Anesthesia Post Note  Patient: Dequann Vandervelden  Procedure(s) Performed: Procedure(s) (LRB): COLONOSCOPY WITH PROPOFOL (N/A) ESOPHAGOGASTRODUODENOSCOPY (EGD) WITH PROPOFOL (N/A) SAVORY DILATION (N/A)  Patient location during evaluation: PACU Anesthesia Type: MAC Level of consciousness: awake and alert and patient cooperative Pain management: pain level controlled Vital Signs Assessment: post-procedure vital signs reviewed and stable Respiratory status: spontaneous breathing and respiratory function stable Postop Assessment: no apparent nausea or vomiting Anesthetic complications: no     Last Vitals:  Vitals:   04/28/17 1045 04/28/17 1050  BP: 130/79 120/80  Pulse:    Resp: 16 12  Temp:    SpO2: 95% 98%    Last Pain:  Vitals:   04/28/17 1050  TempSrc:   PainSc: 0-No pain                 Linnaea Ahn A

## 2017-04-28 NOTE — Op Note (Signed)
[facility] Patient Name: Ethan Frazier Procedure Date: 04/28/2017 10:36 AM MRN: [id number] Date of Birth: May 26, 1961 Attending MD: Jonette Eva MD, MD CSN: [id number] Age: 56 Admit Type: Outpatient Procedure:                Colonoscopy WITH COLD FORCEPS POLYPECTOMY Indications:              Screening for colorectal malignant neoplasm-FATHER                            HAD COLON CANCER AGE 51 Providers:                Jonette Eva MD, MD, Nena Polio, RN, Dyann Ruddle Referring MD:             Samuel Jester MD, MD Medicines:                Propofol per Anesthesia Complications:            No immediate complications. Estimated Blood Loss:     Estimated blood loss was minimal. Procedure:                Pre-Anesthesia Assessment:                           - Prior to the procedure, a History and Physical                            was performed, and patient medications and                            allergies were reviewed. The patient's tolerance of                            previous anesthesia was also reviewed. The risks                            and benefits of the procedure and the sedation                            options and risks were discussed with the patient.                            All questions were answered, and informed consent                            was obtained. Prior Anticoagulants: The patient has                            taken aspirin, last dose was day of procedure. ASA                            Grade Assessment: II - A patient with mild systemic                            disease. After reviewing the risks and benefits,  the patient was deemed in satisfactory condition to                            undergo the procedure. After obtaining informed                            consent, the colonoscope was passed under direct                            vision. Throughout the procedure, the patient's   blood pressure, pulse, and oxygen saturations were                            monitored continuously. The EC-[id number] ([id number])                            scope was introduced through the anus and advanced                            to the the cecum, identified by appendiceal orifice                            and ileocecal valve. The colonoscopy was somewhat                            difficult due to a tortuous colon. Successful                            completion of the procedure was aided by COLOWRAP.                            The patient tolerated the procedure well. The                            quality of the bowel preparation was good. The                            ileocecal valve, appendiceal orifice, and rectum                            were photographed. Scope In: 11:09:14 AM Scope Out: 11:25:35 AM Scope Withdrawal Time: 0 hours 13 minutes 41 seconds  Total Procedure Duration: 0 hours 16 minutes 21 seconds  Findings:      The recto-sigmoid colon and sigmoid colon were mildly redundant.      A 3 mm polyp was found in the rectum. The polyp was sessile. The polyp       was removed with a cold biopsy forceps. Resection and retrieval were       complete.      Multiple small-mouthed diverticula were found in the recto-sigmoid       colon, sigmoid colon and descending colon.      External and internal hemorrhoids were found during retroflexion. The       hemorrhoids were small. Impression:               -  Redundant colon.                           - One 3 mm polyp in the rectum, removed with a cold                            biopsy forceps. Resected and retrieved.                           - Diverticulosis in the recto-sigmoid colon, in the                            sigmoid colon and in the descending colon.                           - External and internal hemorrhoids. Moderate Sedation:      Per Anesthesia Care Recommendation:           - Repeat colonoscopy in 5-10 years  for surveillance.                           - High fiber diet.                           - Continue present medications.                           - Await pathology results.                           - Patient has a contact number available for                            emergencies. The signs and symptoms of potential                            delayed complications were discussed with the                            patient. Return to normal activities tomorrow.                            Written discharge instructions were provided to the                            patient. Procedure Code(s):        --- Professional ---                           [phone], Colonoscopy, flexible; with biopsy, single                            or multiple Diagnosis Code(s):        --- Professional ---                           Z12.11, Encounter  for screening for malignant                            neoplasm of colon                           K62.1, Rectal polyp                           K64.8, Other hemorrhoids                           K57.30, Diverticulosis of large intestine without                            perforation or abscess without bleeding                           Q43.8, Other specified congenital malformations of                            intestine CPT copyright 2016 [employer]. All rights reserved. The codes documented in this report are preliminary and upon coder review may  be revised to meet current compliance requirements. Jonette Eva, MD Jonette Eva MD, MD 04/28/2017 11:47:12 AM This report has been signed electronically. Number of Addenda: 0

## 2017-04-28 NOTE — H&P (Signed)
Primary Care Physician:  Samuel Jester, DO Primary Gastroenterologist:  Dr. Darrick Penna  Pre-Procedure History & Physical: HPI:  Ethan Frazier is a 56 y.o. male here for DYSPHAGIA/screening.  Past Medical History:  Diagnosis Date  . Anxiety   . CAD (coronary artery disease)   . Chronic pain syndrome   . History of gout   . HTN (hypertension)   . Hypercholesteremia   . Neuropathy     Past Surgical History:  Procedure Laterality Date  . CORONARY ARTERY BYPASS GRAFT  2016    Prior to Admission medications   Medication Sig Start Date End Date Taking? Authorizing Provider  ALPRAZolam Prudy Feeler) 1 MG tablet Take 1 mg by mouth at bedtime.   Yes [provider]  aspirin 81 MG chewable tablet Chew 81 mg by mouth daily.   Yes [provider]  gabapentin (NEURONTIN) 300 MG capsule Take 300 mg by mouth at bedtime.   Yes [provider]  loratadine (CLARITIN) 10 MG tablet Take 10 mg by mouth daily.   Yes [provider]  metoprolol succinate (TOPROL-XL) 25 MG 24 hr tablet Take 25 mg by mouth at bedtime.    Yes [provider]  metoprolol tartrate (LOPRESSOR) 25 MG tablet Take 25 mg by mouth at bedtime.    Yes [provider]  oxycodone (OXY-IR) 5 MG capsule Take 20 mg by mouth every 4 (four) hours as needed.   Yes [provider]  pravastatin (PRAVACHOL) 40 MG tablet Take 40 mg by mouth at bedtime.    Yes [provider]    Allergies as of 04/01/2017 - Review Complete 04/01/2017  Allergen Reaction Noted  . Penicillins  07/25/2011    Family History  Problem Relation Age of Onset  . Colon cancer Father        diagnosed at age 68  . Lung cancer Mother     Social History   Social History  . Marital status: Married    Spouse name: N/A  . Number of children: 3  . Years of education: N/A   Occupational History  . logging    Social History Main Topics  . Smoking status: Former Smoker    Packs/day: 2.00   Years: 50.00    Types: Cigarettes    Quit date: 04/02/2015  . Smokeless tobacco: Never Used  . Alcohol use No  . Drug use: Yes    Types: Marijuana     Comment: last used 2016  . Sexual activity: Yes    Birth control/ protection: None   Other Topics Concern  . Not on file   Social History Narrative  . No narrative on file    Review of Systems: See HPI, otherwise negative ROS   Physical Exam: BP 123/72   Pulse 60   Temp 98 F (36.7 C) (Oral)   Resp 16   Ht 6' (1.829 m)   Wt 239 lb (108.4 kg)   SpO2 92%   BMI 32.41 kg/m  General:   Alert,  pleasant and cooperative in NAD Head:  Normocephalic and atraumatic. Neck:  Supple; Lungs:  Clear throughout to auscultation.    Heart:  Regular rate and rhythm. Abdomen:  Soft, nontender and nondistended. Normal bowel sounds, without guarding, and without rebound.   Neurologic:  Alert and  oriented x4;  grossly normal neurologically.  Impression/Plan:     DYSPHAGIA/screening  PLAN:  EGD/DIL/tcs TODAY DISCUSSED PROCEDURE, BENEFITS, & RISKS: < 1% chance of medication reaction, bleeding, perforation, or rupture of  spleen/liver.

## 2017-04-28 NOTE — Transfer of Care (Signed)
Immediate Anesthesia Transfer of Care Note  Patient: Ethan Frazier  Procedure(s) Performed: Procedure(s) with comments: COLONOSCOPY WITH PROPOFOL (N/A) - 9:45am ESOPHAGOGASTRODUODENOSCOPY (EGD) WITH PROPOFOL (N/A) SAVORY DILATION (N/A)  Patient Location: PACU  Anesthesia Type:MAC  Level of Consciousness: awake, alert , oriented and patient cooperative  Airway & Oxygen Therapy: Patient Spontanous Breathing  Post-op Assessment: Report given to RN and Post -op Vital signs reviewed and stable  Post vital signs: Reviewed and stable  Last Vitals:  Vitals:   04/28/17 1045 04/28/17 1050  BP: 130/79 120/80  Pulse:    Resp: 16 12  Temp:    SpO2: 95% 98%    Last Pain:  Vitals:   04/28/17 1050  TempSrc:   PainSc: 0-No pain      Patients Stated Pain Goal: 6 (29/02/11 1552)  Complications: No apparent anesthesia complications

## 2017-04-28 NOTE — Anesthesia Preprocedure Evaluation (Signed)
Anesthesia Evaluation  Patient identified by MRN, date of birth, ID band Patient awake    Reviewed: Allergy & Precautions, NPO status , Patient's Chart, lab work & pertinent test results, reviewed documented beta blocker date and time   Airway Mallampati: II  TM Distance: >3 FB Neck ROM: Full    Dental  (+) Poor Dentition, Chipped, Missing, Loose, Dental Advisory Given,    Pulmonary former smoker ( 100 pack-yrs),    breath sounds clear to auscultation       Cardiovascular hypertension, Pt. on medications and Pt. on home beta blockers + CAD and + CABG   Rhythm:Regular Rate:Normal     Neuro/Psych PSYCHIATRIC DISORDERS Anxiety    GI/Hepatic Neg liver ROS, dysphagia   Endo/Other  negative endocrine ROS  Renal/GU negative Renal ROS     Musculoskeletal   Abdominal   Peds  Hematology negative hematology ROS (+)   Anesthesia Other Findings   Reproductive/Obstetrics                            Anesthesia Physical Anesthesia Plan  ASA: III  Anesthesia Plan: MAC   Post-op Pain Management:    Induction: Intravenous  PONV Risk Score and Plan:   Airway Management Planned: Simple Face Mask  Additional Equipment:   Intra-op Plan:   Post-operative Plan:   Informed Consent: I have reviewed the patients History and Physical, chart, labs and discussed the procedure including the risks, benefits and alternatives for the proposed anesthesia with the patient or authorized representative who has indicated his/her understanding and acceptance.     Plan Discussed with:   Anesthesia Plan Comments:         Anesthesia Quick Evaluation

## 2017-04-28 NOTE — Discharge Instructions (Signed)
You had ONE RECTAL polyp removed. YOU HAVE GASTRITIS DUE TO ASPIRIN. I STRETCHED YOUR ESOPHAGUS BECAUSE OF YOUR PROBLEM SWALLOWING, WHICH IS DUE TO AN ESOPHAGEAL STRICTURE AND AN ESOPHAGEAL DIVERTICULUM YOU DO NOT HAVE BARRETT'S ESOPHAGUS. YOU HAVE SMALL INTERNAL AND EXTERNAL  HEMORRHOIDS. 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   FOLLOW A SOFT MECHANICAL/HIGH FIBER DIET. AVOID ITEMS THAT CAUSE BLOATING. SEE INFO BELOW.   START PROTONIX TO PREVENT ULCERS  TAKE 30 MINUTES PRIOR TO YOUR FIRST MEAL.  YOUR BIOPSY RESULTS WILL BE AVAILABLE IN MY CHART SEP 21 AND MY OFFICE WILL CONTACT YOU IN 10-14 DAYS WITH YOUR RESULTS.   Follow up in 4 mos.  Next colonoscopy in 10 years.   ENDOSCOPY Care After Read the instructions outlined below and refer to this sheet in the next week. These discharge instructions provide you with general information on caring for yourself after you leave the hospital. While your treatment has been planned according to the most current medical practices available, unavoidable complications occasionally occur. If you have any problems or questions after discharge, call DR. FIELDS, [phone].  ACTIVITY  You may resume your regular activity, but move at a slower pace for the next 24 hours.   Take frequent rest periods for the next 24 hours.   Walking will help get rid of the air and reduce the bloated feeling in your belly (abdomen).   No driving for 24 hours (because of the medicine (anesthesia) used during the test).   You may shower.   Do not sign any important legal documents or operate any machinery for 24 hours (because of the anesthesia used during the test).    NUTRITION  Drink plenty of fluids.   You may resume your normal diet as instructed by your doctor.   Begin with a light meal and progress to your normal diet. Heavy or fried foods are harder to digest and may make you feel sick to your stomach (nauseated).   Avoid alcoholic beverages for 24  hours or as instructed.    MEDICATIONS  You may resume your normal medications.   WHAT YOU CAN EXPECT TODAY  Some feelings of bloating in the abdomen.   Passage of more gas than usual.   Spotting of blood in your stool or on the toilet paper  .  IF YOU HAD POLYPS REMOVED DURING THE ENDOSCOPY:  Eat a soft diet IF YOU HAVE NAUSEA, BLOATING, ABDOMINAL PAIN, OR VOMITING.    FINDING OUT THE RESULTS OF YOUR TEST Not all test results are available during your visit. DR. Darrick Penna WILL CALL YOU WITHIN 14 DAYS OF YOUR PROCEDUE WITH YOUR RESULTS. Do not assume everything is normal if you have not heard from DR. FIELDS, CALL HER OFFICE AT [phone].  SEEK IMMEDIATE MEDICAL ATTENTION AND CALL THE OFFICE: [phone] IF:  You have more than a spotting of blood in your stool.   Your belly is swollen (abdominal distention).   You are nauseated or vomiting.   You have a temperature over 101F.   You have abdominal pain or discomfort that is severe or gets worse throughout the day.  High-Fiber Diet A high-fiber diet changes your normal diet to include more whole grains, legumes, fruits, and vegetables. Changes in the diet involve replacing refined carbohydrates with unrefined foods. The calorie level of the diet is essentially unchanged. The Dietary Reference Intake (recommended amount) for adult males is 38 grams per day. For adult females, it is 25 grams per day. Pregnant and  lactating women should consume 28 grams of fiber per day. Fiber is the intact part of a plant that is not broken down during digestion. Functional fiber is fiber that has been isolated from the plant to provide a beneficial effect in the body. PURPOSE  Increase stool bulk.   Ease and regulate bowel movements.   Lower cholesterol.  REDUCE RISK OF COLON CANCER  INDICATIONS THAT YOU NEED MORE FIBER  Constipation and hemorrhoids.   Uncomplicated diverticulosis (intestine condition) and irritable bowel syndrome.    Weight management.   As a protective measure against hardening of the arteries (atherosclerosis), diabetes, and cancer.   GUIDELINES FOR INCREASING FIBER IN THE DIET  Start adding fiber to the diet slowly. A gradual increase of about 5 more grams (2 slices of whole-wheat bread, 2 servings of most fruits or vegetables, or 1 bowl of high-fiber cereal) per day is best. Too rapid an increase in fiber may result in constipation, flatulence, and bloating.   Drink enough water and fluids to keep your urine clear or pale yellow. Water, juice, or caffeine-free drinks are recommended. Not drinking enough fluid may cause constipation.   Eat a variety of high-fiber foods rather than one type of fiber.   Try to increase your intake of fiber through using high-fiber foods rather than fiber pills or supplements that contain small amounts of fiber.   The goal is to change the types of food eaten. Do not supplement your present diet with high-fiber foods, but replace foods in your present diet.   INCLUDE A VARIETY OF FIBER SOURCES  Replace refined and processed grains with whole grains, canned fruits with fresh fruits, and incorporate other fiber sources. White rice, white breads, and most bakery goods contain little or no fiber.   Brown whole-grain rice, buckwheat oats, and many fruits and vegetables are all good sources of fiber. These include: broccoli, Brussels sprouts, cabbage, cauliflower, beets, sweet potatoes, white potatoes (skin on), carrots, tomatoes, eggplant, squash, berries, fresh fruits, and dried fruits.   Cereals appear to be the richest source of fiber. Cereal fiber is found in whole grains and bran. Bran is the fiber-rich outer coat of cereal grain, which is largely removed in refining. In whole-grain cereals, the bran remains. In breakfast cereals, the largest amount of fiber is found in those with "bran" in their names. The fiber content is sometimes indicated on the label.   You may  need to include additional fruits and vegetables each day.   In baking, for 1 cup white flour, you may use the following substitutions:   1 cup whole-wheat flour minus 2 tablespoons.   1/2 cup white flour plus 1/2 cup whole-wheat flour.    Gastritis  Gastritis is an inflammation (the body's way of reacting to injury and/or infection) of the stomach. It is often caused by viral or bacterial (germ) infections. It can also be caused BY ASPIRIN, BC/GOODY POWDER'S, (IBUPROFEN) MOTRIN, OR ALEVE (NAPROXEN), chemicals (including alcohol), SPICY FOODS, and medications. This illness may be associated with generalized malaise (feeling tired, not well), UPPER ABDOMINAL STOMACH cramps, and fever. One common bacterial cause of gastritis is an organism known as H. Pylori. This can be treated with antibiotics.   Polyps, Colon  A polyp is extra tissue that grows inside your body. Colon polyps grow in the large intestine. The large intestine, also called the colon, is part of your digestive system. It is a long, hollow tube at the end of your digestive tract where your  body makes and stores stool.Most polyps are not dangerous. They are benign. This means they are not cancerous. But over time, some types of polyps can turn into cancer. Polyps that are smaller than a pea are usually not harmful. But larger polyps could someday become or may already be cancerous. To be safe, doctors remove all polyps and test them.   PREVENTION There is not one sure way to prevent polyps. You might be able to lower your risk of getting them if you:  Eat more fruits and vegetables and less fatty food.   Do not smoke.   Avoid alcohol.   Exercise every day.   Lose weight if you are overweight.   Eating more calcium and folate can also lower your risk of getting polyps. Some foods that are rich in calcium are milk, cheese, and broccoli. Some foods that are rich in folate are chickpeas, kidney beans, and spinach.   Diverticulosis Diverticulosis is a common condition that develops when small pouches (diverticula) form in the wall of the colon. The risk of diverticulosis increases with age. It happens more often in people who eat a low-fiber diet. Most individuals with diverticulosis have no symptoms. Those individuals with symptoms usually experience belly (abdominal) pain, constipation, or loose stools (diarrhea).  HOME CARE INSTRUCTIONS Increase the amount of fiber in your diet as directed by your caregiver or dietician. This may reduce symptoms of diverticulosis.  Drink at least 6 to 8 glasses of water each day to prevent constipation.  Try not to strain when you have a bowel movement.  Avoiding nuts and seeds to prevent complications is NOT NECESSARY.  FOODS HAVING HIGH FIBER CONTENT INCLUDE: Fruits. Apple, peach, pear, tangerine, raisins, prunes.  Vegetables. Brussels sprouts, asparagus, broccoli, cabbage, carrot, cauliflower, romaine lettuce, spinach, summer squash, tomato, winter squash, zucchini.  Starchy Vegetables. Baked beans, kidney beans, lima beans, split peas, lentils, potatoes (with skin).  Grains. Whole wheat bread, brown rice, bran flake cereal, plain oatmeal, white rice, shredded wheat, bran muffins.   SEEK IMMEDIATE MEDICAL CARE IF: You develop increasing pain or severe bloating.  You have an oral temperature above 101F.  You develop vomiting or bowel movements that are bloody or black.    Hemorrhoids Hemorrhoids are dilated (enlarged) veins around the rectum. Sometimes clots will form in the veins. This makes them swollen and painful. These are called thrombosed hemorrhoids. Causes of hemorrhoids include:  Constipation.   Straining to have a bowel movement.   HEAVY LIFTING   HOME CARE INSTRUCTIONS  Eat a well balanced diet and drink 6 to 8 glasses of water every day to avoid constipation. You may also use a bulk laxative.   Avoid straining to have bowel movements.    Keep anal area dry and clean.   Do not use a donut shaped pillow or sit on the toilet for long periods. This increases blood pooling and pain.   Move your bowels when your body has the urge; this will require less straining and will decrease pain and pressure.    Colonoscopy, Adult, Care After This sheet gives you information about how to care for yourself after your procedure. Your health care provider may also give you more specific instructions. If you have problems or questions, contact your health care provider. What can I expect after the procedure? After the procedure, it is common to have:  A small amount of blood in your stool for 24 hours after the procedure.  Some gas.  Mild abdominal cramping  or bloating.  Follow these instructions at home: General instructions   For the first 24 hours after the procedure: ? Do not drive or use machinery. ? Do not sign important documents. ? Do not drink alcohol. ? Do your regular daily activities at a slower pace than normal. ? Eat soft, easy-to-digest foods. ? Rest often.  Take over-the-counter or prescription medicines only as told by your health care provider.  It is up to you to get the results of your procedure. Ask your health care provider, or the department performing the procedure, when your results will be ready. Relieving cramping and bloating  Try walking around when you have cramps or feel bloated.  Apply heat to your abdomen as told by your health care provider. Use a heat source that your health care provider recommends, such as a moist heat pack or a heating pad. ? Place a towel between your skin and the heat source. ? Leave the heat on for 20-30 minutes. ? Remove the heat if your skin turns bright red. This is especially important if you are unable to feel pain, heat, or cold. You may have a greater risk of getting burned. Eating and drinking  Drink enough fluid to keep your urine clear or pale  yellow.  Resume your normal diet as instructed by your health care provider. Avoid heavy or fried foods that are hard to digest.  Avoid drinking alcohol for as long as instructed by your health care provider. Contact a health care provider if:  You have blood in your stool 2-3 days after the procedure. Get help right away if:  You have more than a small spotting of blood in your stool.  You pass large blood clots in your stool.  Your abdomen is swollen.  You have nausea or vomiting.  You have a fever.  You have increasing abdominal pain that is not relieved with medicine. This information is not intended to replace advice given to you by your health care provider. Make sure you discuss any questions you have with your health care provider. Document Released: 03/11/2004 Document Revised: 04/21/2016 Document Reviewed: 10/09/2015 Elsevier Interactive Patient Education  [employer].

## 2017-04-28 NOTE — Op Note (Addendum)
[facility] Patient Name: Ethan Frazier Procedure Date: 04/28/2017 11:25 AM MRN: [id number] Date of Birth: 1961-03-09 Attending MD: Jonette Eva MD, MD CSN: [id number] Age: 56 Admit Type: Outpatient Procedure:                Upper GI endoscopy WITH ESOPHAGEAL DILATION/COLD                            FORCEPS BIOPSY Indications:              Dysphagia Providers:                Jonette Eva MD, MD, Nena Polio, RN, Dyann Ruddle Referring MD:             Samuel Jester MD, MD Medicines:                Propofol per Anesthesia Complications:            No immediate complications. Estimated Blood Loss:     Estimated blood loss was minimal. Procedure:                Pre-Anesthesia Assessment:                           - Prior to the procedure, a History and Physical                            was performed, and patient medications and                            allergies were reviewed. The patient's tolerance of                            previous anesthesia was also reviewed. The risks                            and benefits of the procedure and the sedation                            options and risks were discussed with the patient.                            All questions were answered, and informed consent                            was obtained. Prior Anticoagulants: The patient has                            taken aspirin, last dose was day of procedure. ASA                            Grade Assessment: II - A patient with mild systemic                            disease. After reviewing the risks and benefits,  the patient was deemed in satisfactory condition to                            undergo the procedure. After obtaining informed                            consent, the endoscope was passed under direct                            vision. Throughout the procedure, the patient's                            blood pressure, pulse, and oxygen saturations  were                            monitored continuously. The [id number] ([id number])                            scope was introduced through the mouth, and                            advanced to the second part of duodenum. The upper                            GI endoscopy was accomplished without difficulty.                            The patient tolerated the procedure well. Scope In: 11:29:11 AM Scope Out: 11:41:52 AM Total Procedure Duration: 0 hours 12 minutes 41 seconds  Findings:      One moderate (circumferential scarring or stenosis; an endoscope may       pass) benign-appearing, intrinsic stenosis was found. And was traversed.       A guidewire was placed and the scope was withdrawn. Dilation was       performed with a Savary dilator with mild resistance at 15 mm, 16 mm and       17 mm.      A non-bleeding TRABECULATED diverticulum with a large opening and no       stigmata of recent bleeding was found in the distal esophagus, JUST       PROXIMAL TO THE Z LINE. Biopsies were taken with a cold forceps for       histology.      Patchy mild inflammation characterized by congestion (edema), erosions       and erythema was found in the gastric body and in the gastric antrum.       Biopsies were taken with a cold forceps for Helicobacter pylori testing.      The examined duodenum was normal. Impression:               - Benign-appearing esophageal stenosis.                           - Diverticulum in the distal esophagus.                           - MILD  Gastritis. Moderate Sedation:      Per Anesthesia Care Recommendation:           - Await pathology results.                           - Soft diet.                           - Continue present medications.                           - Return to my office in 4 months.                           - Use Protonix (pantoprazole) 40 mg PO daily                            indefinitely.                           - Patient has a contact number  available for                            emergencies. The signs and symptoms of potential                            delayed complications were discussed with the                            patient. Return to normal activities tomorrow.                            Written discharge instructions were provided to the                            patient. Procedure Code(s):        --- Professional ---                           [phone], Esophagogastroduodenoscopy, flexible,                            transoral; with insertion of guide wire followed by                            passage of dilator(s) through esophagus over guide                            wire                           [id number], Esophagogastroduodenoscopy, flexible,                            transoral; with biopsy, single or multiple Diagnosis Code(s):        --- Professional ---  K22.2, Esophageal obstruction                           Q39.6, Congenital diverticulum of esophagus                           K29.70, Gastritis, unspecified, without bleeding                           R13.10, Dysphagia, unspecified CPT copyright 2016 American Medical Association. All rights reserved. The codes documented in this report are preliminary and upon coder review may  be revised to meet current compliance requirements. Jonette Eva, MD Jonette Eva MD, MD 04/28/2017 11:51:50 AM This report has been signed electronically. Number of Addenda: 0

## 2017-04-30 ENCOUNTER — Telehealth: Payer: Self-pay | Admitting: Gastroenterology

## 2017-04-30 NOTE — Telephone Encounter (Signed)
PT is aware.

## 2017-04-30 NOTE — Telephone Encounter (Signed)
OV made and reminder in epic °

## 2017-04-30 NOTE — Telephone Encounter (Signed)
Please call pt. His ESOPHAGEAL BIOPSIES SHOW CHANGES FROM REFLUX. HIS stomach Bx shows HE DOES NOT HAVE H PYLORI INFECTION. HE had a BENIGN polypoid lesion removed FROM HIS RECTUM.    FOLLOW A SOFT MECHANICAL/HIGH FIBER DIET. AVOID ITEMS THAT CAUSE BLOATING.   START PROTONIX TO PREVENT ULCERS AND TREAT REFLUX.  TAKE 30 MINUTES PRIOR TO YOUR FIRST MEAL.  Follow up in 4 mos E30 DYSPHAGIA/GERD.  Next colonoscopy in 10 years.

## 2017-05-01 ENCOUNTER — Encounter (HOSPITAL_COMMUNITY): Payer: Self-pay | Admitting: Gastroenterology

## 2017-05-22 ENCOUNTER — Encounter (HOSPITAL_COMMUNITY): Payer: Self-pay

## 2017-05-22 ENCOUNTER — Emergency Department (HOSPITAL_COMMUNITY): Payer: Self-pay

## 2017-05-22 ENCOUNTER — Emergency Department (HOSPITAL_COMMUNITY)
Admission: EM | Admit: 2017-05-22 | Discharge: 2017-05-22 | Disposition: A | Discharge status: Home / Self Care | Payer: Self-pay | Attending: Emergency Medicine | Admitting: Emergency Medicine

## 2017-05-22 DIAGNOSIS — Z79899 Other long term (current) drug therapy: Secondary | ICD-10-CM | POA: Insufficient documentation

## 2017-05-22 DIAGNOSIS — W230XXA Caught, crushed, jammed, or pinched between moving objects, initial encounter: Secondary | ICD-10-CM | POA: Insufficient documentation

## 2017-05-22 DIAGNOSIS — Y9389 Activity, other specified: Secondary | ICD-10-CM | POA: Insufficient documentation

## 2017-05-22 DIAGNOSIS — Z7982 Long term (current) use of aspirin: Secondary | ICD-10-CM | POA: Insufficient documentation

## 2017-05-22 DIAGNOSIS — Y999 Unspecified external cause status: Secondary | ICD-10-CM | POA: Insufficient documentation

## 2017-05-22 DIAGNOSIS — I2510 Atherosclerotic heart disease of native coronary artery without angina pectoris: Secondary | ICD-10-CM | POA: Insufficient documentation

## 2017-05-22 DIAGNOSIS — S39012A Strain of muscle, fascia and tendon of lower back, initial encounter: Secondary | ICD-10-CM | POA: Insufficient documentation

## 2017-05-22 DIAGNOSIS — Y929 Unspecified place or not applicable: Secondary | ICD-10-CM | POA: Insufficient documentation

## 2017-05-22 DIAGNOSIS — S8251XA Displaced fracture of medial malleolus of right tibia, initial encounter for closed fracture: Secondary | ICD-10-CM | POA: Insufficient documentation

## 2017-05-22 DIAGNOSIS — Z87891 Personal history of nicotine dependence: Secondary | ICD-10-CM | POA: Insufficient documentation

## 2017-05-22 DIAGNOSIS — I10 Essential (primary) hypertension: Secondary | ICD-10-CM | POA: Insufficient documentation

## 2017-05-22 MED ORDER — KETOROLAC TROMETHAMINE 30 MG/ML IJ SOLN
30.0000 mg | Freq: Once | INTRAMUSCULAR | Status: AC
  Administered 2017-05-22: 30 mg via INTRAMUSCULAR
  Filled 2017-05-22: qty 1

## 2017-05-22 MED ORDER — METHOCARBAMOL 500 MG PO TABS: 500.0000 mg | ORAL_TABLET | Freq: Two times a day (BID) | ORAL | 0 refills | Status: AC

## 2017-05-22 MED ORDER — NAPROXEN 500 MG PO TABS: 500.0000 mg | ORAL_TABLET | Freq: Two times a day (BID) | ORAL | 0 refills | Status: AC

## 2017-05-22 NOTE — Discharge Instructions (Signed)
Take the muscle relaxers and anti-inflammatories as prescribed. Follow-up with your doctor as well as Dr. Romeo Apple. Return to the ED if you develop new or worsening symptoms.
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
## 2017-08-28 ENCOUNTER — Ambulatory Visit: Payer: Self-pay | Admitting: Nurse Practitioner

## 2018-07-14 IMAGING — DX DG LUMBAR SPINE COMPLETE 4+V
5 series · 5 of 5 positions shown · non-contrast
Comparison: CT of the abdomen and pelvis performed 03/18/2011

CLINICAL DATA: Status post fall, with lower back pain. Initial
encounter.

EXAM:
LUMBAR SPINE - COMPLETE 4+ VIEW

[l-spine ap]
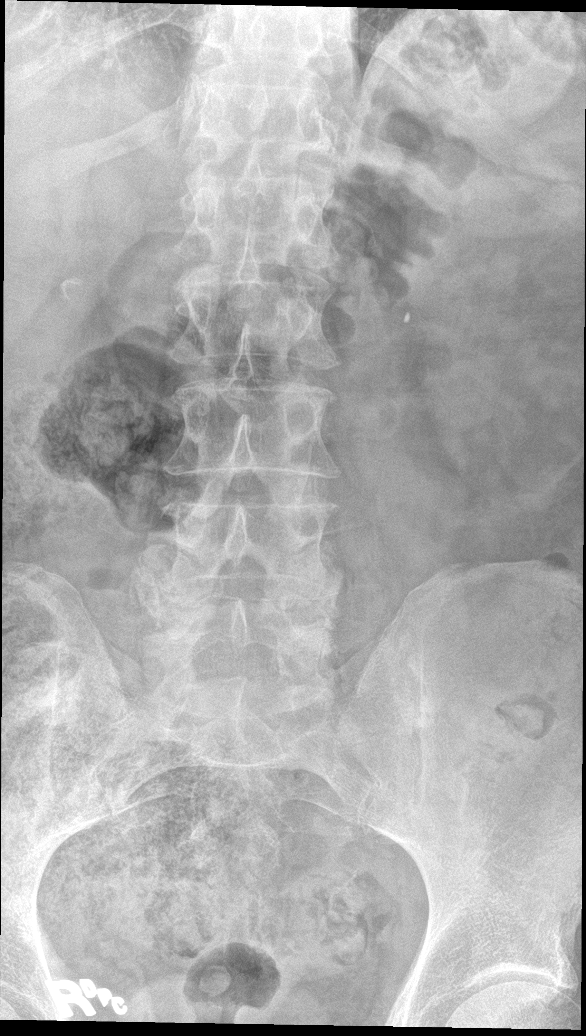

[l-spine obl (1 of 2)]
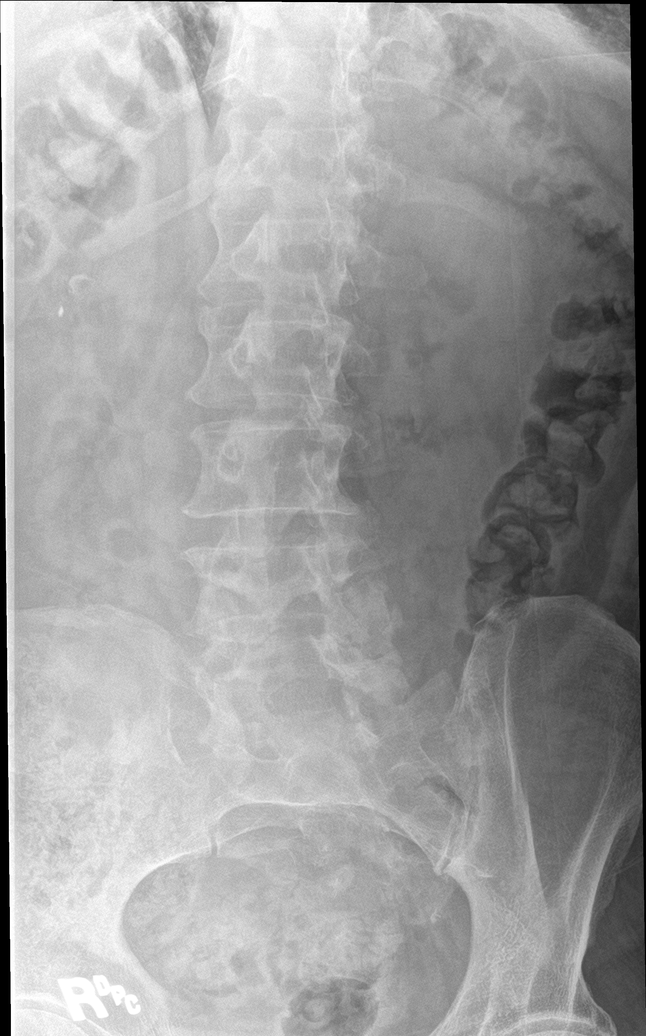

[l-spine obl (2 of 2)]
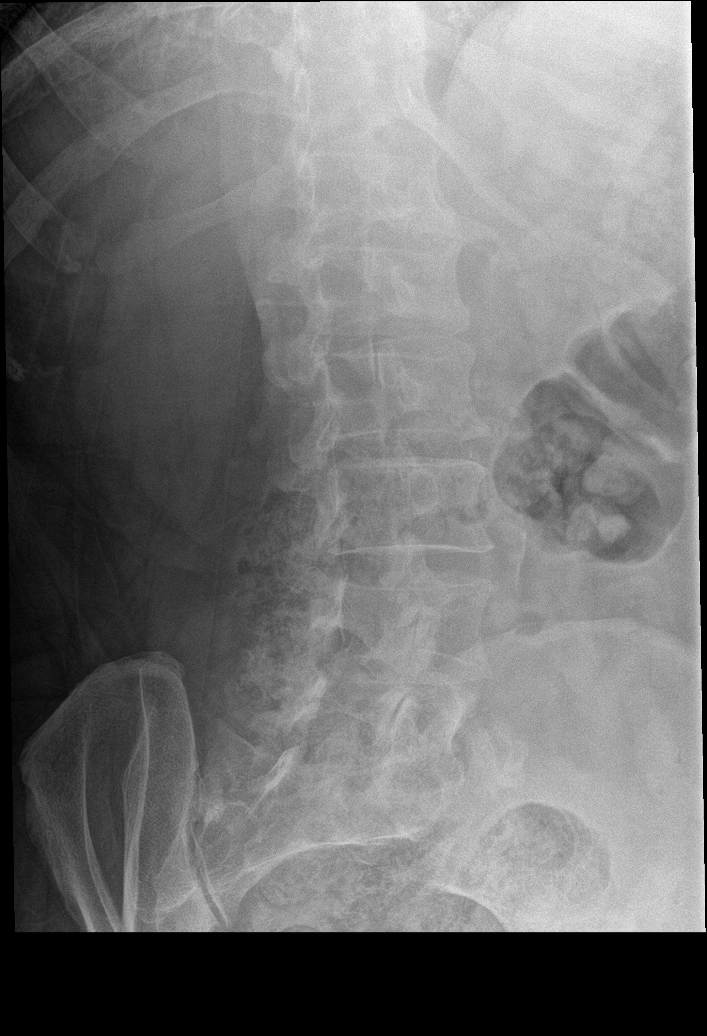

[l-spine lat]
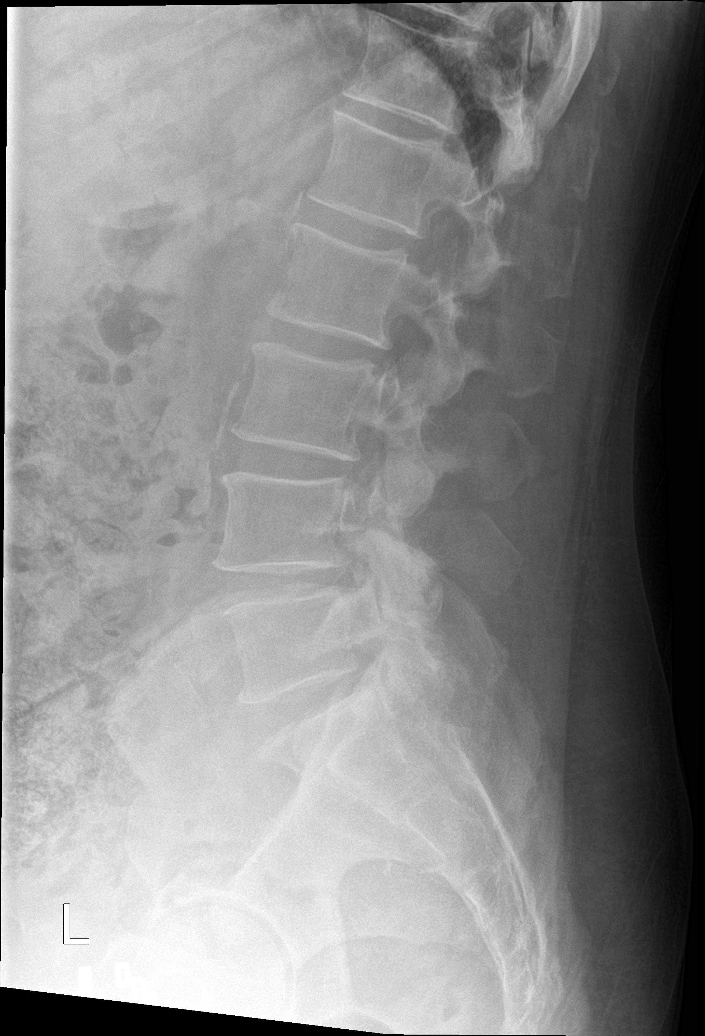

[l-spine spot]
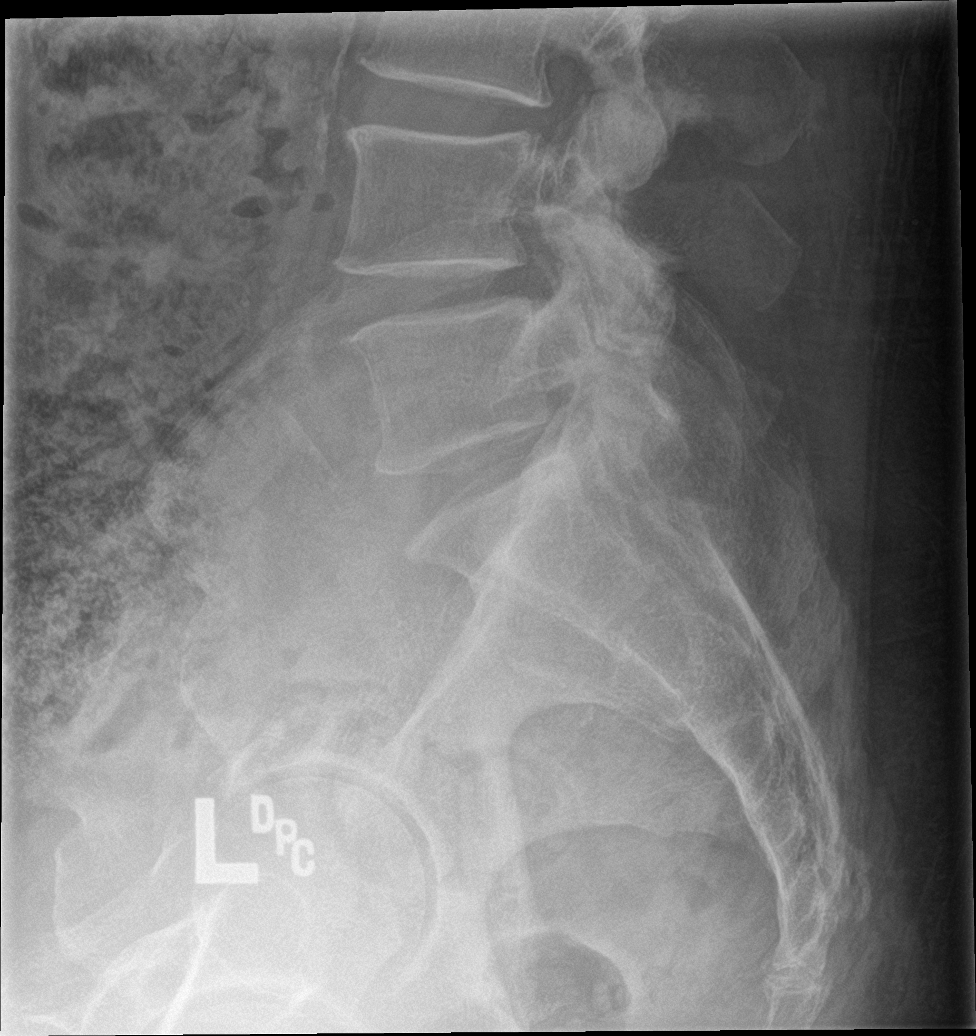

[5 of 5 positions shown; findings below may reference images not displayed]

FINDINGS: There is no evidence of fracture or subluxation. Vertebral bodies
demonstrate normal height and alignment. Intervertebral disc spaces
are preserved. Mild facet disease is noted at the lower lumbar
spine.

The visualized bowel gas pattern is unremarkable in appearance; air
and stool are noted within the colon. The sacroiliac joints are
within normal limits. Mild calcification is noted along the
abdominal aorta.
IMPRESSION: 1. No evidence of fracture or subluxation along the lumbar spine.
2. Mild aortic atherosclerosis.
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
## 2023-11-23 ENCOUNTER — Encounter: Payer: Self-pay | Admitting: *Deleted

## 2023-12-28 ENCOUNTER — Other Ambulatory Visit: Payer: Self-pay | Admitting: *Deleted

## 2023-12-28 DIAGNOSIS — R2240 Localized swelling, mass and lump, unspecified lower limb: Secondary | ICD-10-CM

## 2023-12-31 ENCOUNTER — Ambulatory Visit: Admitting: General Surgery

## 2024-02-02 ENCOUNTER — Encounter: Payer: Self-pay | Admitting: General Surgery

## 2024-02-02 ENCOUNTER — Ambulatory Visit (INDEPENDENT_AMBULATORY_CARE_PROVIDER_SITE_OTHER): Admitting: General Surgery

## 2024-02-02 VITALS — BP 186/100 | HR 82 | Temp 98.2°F | Resp 18 | Ht 72.0 in | Wt 246.0 lb

## 2024-02-02 DIAGNOSIS — D1722 Benign lipomatous neoplasm of skin and subcutaneous tissue of left arm: Secondary | ICD-10-CM

## 2024-02-02 DIAGNOSIS — D1724 Benign lipomatous neoplasm of skin and subcutaneous tissue of left leg: Secondary | ICD-10-CM

## 2024-02-03 NOTE — Progress Notes (Signed)
 Ethan Frazier; [id number]; 06-30-1961   HPI Patient is a 63 year old white male who was referred to my care by Dorothey Cassette, NP for evaluation and treatment of a lipoma on the upper left thigh.  Patient states he has had this for many years and has not noticed any change in size.  It causes him minimal discomfort.  He states his medical doctor wanted him to get it evaluated. Past Medical History:  Diagnosis Date   Anxiety    CAD (coronary artery disease)    Chronic pain syndrome    History of gout    HTN (hypertension)    Hypercholesteremia    Neuropathy     Past Surgical History:  Procedure Laterality Date   BIOPSY  04/28/2017   Procedure: BIOPSY;  Surgeon: Harvey Margo CROME, MD;  Location: [facility];  Service: Endoscopy;;  gastric and esophagus   COLONOSCOPY WITH PROPOFOL  N/A 04/28/2017   Procedure: COLONOSCOPY WITH PROPOFOL ;  Surgeon: Harvey Margo CROME, MD;  Location: [facility];  Service: Endoscopy;  Laterality: N/A;  9:45am   CORONARY ARTERY BYPASS GRAFT  2016   ESOPHAGOGASTRODUODENOSCOPY (EGD) WITH PROPOFOL  N/A 04/28/2017   Procedure: ESOPHAGOGASTRODUODENOSCOPY (EGD) WITH PROPOFOL ;  Surgeon: Harvey Margo CROME, MD;  Location: [facility];  Service: Endoscopy;  Laterality: N/A;   POLYPECTOMY  04/28/2017   Procedure: POLYPECTOMY;  Surgeon: Harvey Margo CROME, MD;  Location: [facility];  Service: Endoscopy;;  rectal   SAVORY DILATION N/A 04/28/2017   Procedure: SAVORY DILATION;  Surgeon: Harvey Margo CROME, MD;  Location: [facility];  Service: Endoscopy;  Laterality: N/A;    Family History  Problem Relation Age of Onset   Colon cancer Father        diagnosed at age 89   Lung cancer Mother     Current Outpatient Medications on File Prior to Visit  Medication Sig Dispense Refill   aspirin 81 MG chewable tablet Chew 81 mg by mouth daily.     losartan (COZAAR) 50 MG tablet Take 50 mg by mouth daily.     metoprolol succinate (TOPROL-XL) 25 MG 24 hr tablet Take 25 mg by mouth  at bedtime.      metoprolol tartrate (LOPRESSOR) 25 MG tablet Take 25 mg by mouth at bedtime.      Oxycodone HCl 10 MG TABS Take 10 mg by mouth daily as needed.     pantoprazole  (PROTONIX ) 40 MG tablet 1 po 30 mins prior to first meal 30 tablet 11   pravastatin (PRAVACHOL) 40 MG tablet Take 40 mg by mouth at bedtime.      gabapentin (NEURONTIN) 300 MG capsule Take 300 mg by mouth at bedtime.     loratadine (CLARITIN) 10 MG tablet Take 10 mg by mouth daily.     methocarbamol  (ROBAXIN ) 500 MG tablet Take 1 tablet (500 mg total) by mouth 2 (two) times daily. 20 tablet 0   naproxen  (NAPROSYN ) 500 MG tablet Take 1 tablet (500 mg total) by mouth 2 (two) times daily. 30 tablet 0   No current facility-administered medications on file prior to visit.    Allergies  Allergen Reactions   Penicillins     Childhood allergy  almost killed me Has patient had a PCN reaction causing immediate rash, facial/tongue/throat swelling, SOB or lightheadedness with hypotension: Unknown Has patient had a PCN reaction causing severe rash involving mucus membranes or skin necrosis: Unknown Has patient had a PCN reaction that required hospitalization: Unknown Has patient had a PCN reaction  occurring within the last 10 years: No If all of the above answers are NO, then may proceed with Cephalosporin use.     Social History   Substance and Sexual Activity  Alcohol Use No    Social History   Tobacco Use  Smoking Status Former   Current packs/day: 0.00   Average packs/day: 2.0 packs/day for 50.0 years (100.0 ttl pk-yrs)   Types: Cigarettes   Start date: 04/01/1965   Quit date: 04/02/2015   Years since quitting: 8.8  Smokeless Tobacco Never    Review of Systems  Constitutional: Negative.   HENT: Negative.    Eyes:  Positive for pain.  Respiratory: Negative.    Cardiovascular: Negative.   Gastrointestinal: Negative.   Genitourinary:  Positive for frequency.  Musculoskeletal:  Positive for back pain.   Skin: Negative.   Neurological: Negative.   Endo/Heme/Allergies: Negative.   Psychiatric/Behavioral: Negative.      Objective   Vitals:   02/02/24 1446  BP: (!) 186/100  Pulse: 82  Resp: 18  Temp: 98.2 F (36.8 C)  SpO2: 94%    Physical Exam Vitals reviewed.  Constitutional:      Appearance: Normal appearance. He is not ill-appearing.  HENT:     Head: Normocephalic and atraumatic.   Cardiovascular:     Rate and Rhythm: Normal rate and regular rhythm.     Heart sounds: Normal heart sounds. No murmur heard.    No friction rub. No gallop.  Pulmonary:     Effort: Pulmonary effort is normal. No respiratory distress.     Breath sounds: Normal breath sounds. No stridor. No wheezing, rhonchi or rales.   Skin:    General: Skin is warm and dry.     Comments: Large rubbery mobile subcutaneous mass, approximately 7 x 4 cm in size in the upper, inner left thigh.   Neurological:     Mental Status: He is alert and oriented to person, place, and time.     Assessment  Lipoma, left thigh.  This appears clinically to be benign in nature. Plan  I offered excision of the lipoma, the patient would like to defer at this time.  I told him I could not give him a 100% guarantee that was not cancer, though there are no clinical findings suggestive of this.  He understands.  He will return should he desire excision or the mass increases in size and becomes firm.  Follow-up here as needed.
